# Patient Record
Sex: Female | Born: 1990 | Race: White | Hispanic: No | Marital: Single | Smoking: Never smoker
Health system: Southern US, Community
[De-identification: ages and names within clinical notes are randomized; demographics above are authoritative.]

## PROBLEM LIST (undated history)

## (undated) DIAGNOSIS — F988 Other specified behavioral and emotional disorders with onset usually occurring in childhood and adolescence: Secondary | ICD-10-CM

## (undated) HISTORY — PX: TONSILECTOMY, ADENOIDECTOMY, BILATERAL MYRINGOTOMY AND TUBES: SHX2538

## (undated) HISTORY — DX: Other specified behavioral and emotional disorders with onset usually occurring in childhood and adolescence: F98.8

---

## 2011-12-07 ENCOUNTER — Ambulatory Visit: Payer: Self-pay

## 2013-11-08 ENCOUNTER — Emergency Department: Payer: Self-pay | Admitting: Emergency Medicine

## 2013-11-16 ENCOUNTER — Ambulatory Visit: Payer: Self-pay | Admitting: Internal Medicine

## 2013-12-05 ENCOUNTER — Ambulatory Visit: Payer: Self-pay | Admitting: Internal Medicine

## 2013-12-05 LAB — HCG, QUANTITATIVE, PREGNANCY: Beta Hcg, Quant.: <1 m[IU]/mL — ABNORMAL LOW

## 2014-06-05 ENCOUNTER — Ambulatory Visit: Payer: Self-pay | Admitting: Unknown Physician Specialty

## 2014-06-07 LAB — PATHOLOGY REPORT

## 2014-06-23 IMAGING — US ABDOMEN ULTRASOUND
1 series · 13 of 25 positions shown · non-contrast
Comparison: None.

CLINICAL DATA: Generalized abdominal pain with frequent bowel
movements

EXAM:
ULTRASOUND ABDOMEN COMPLETE

[Series 1: abdomen ultrasound · 0.33mm/px · 13 of 74 slices shown]
[im 1/74]
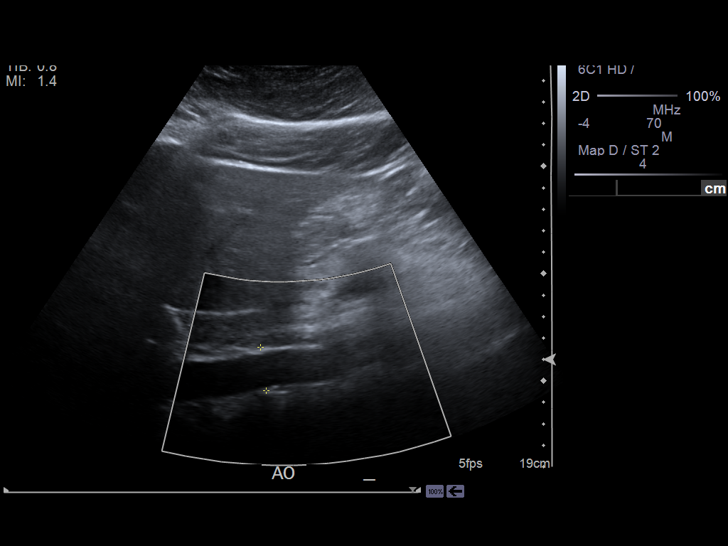
[im 7/74]
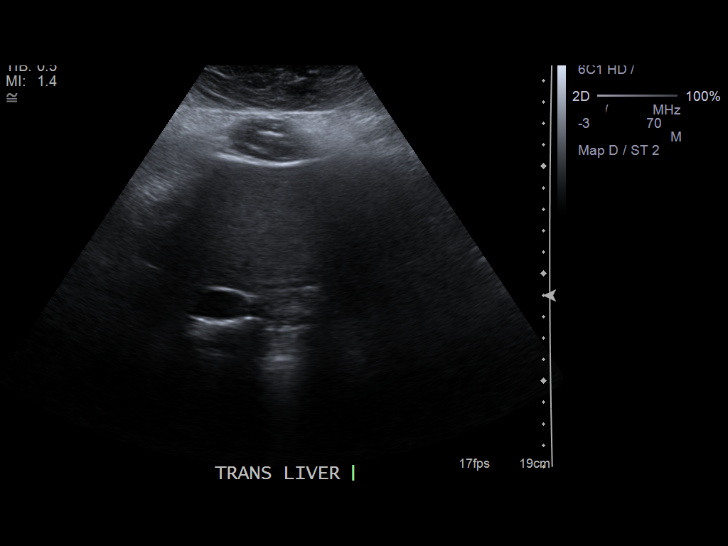
[im 13/74]
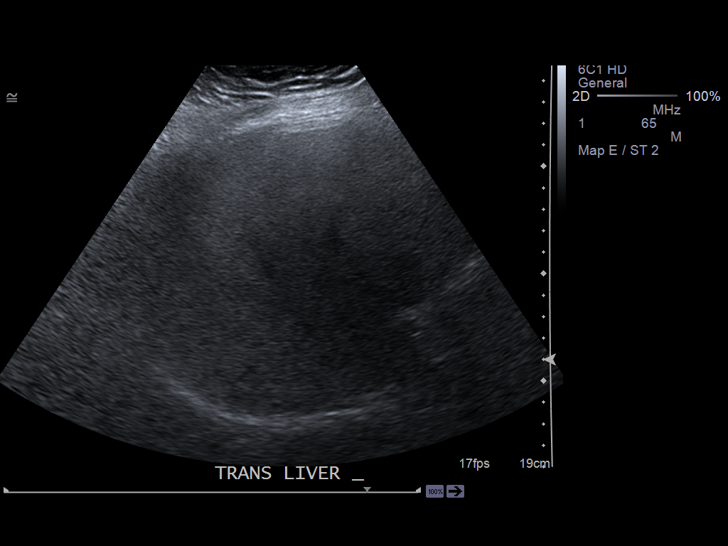
[im 19/74]
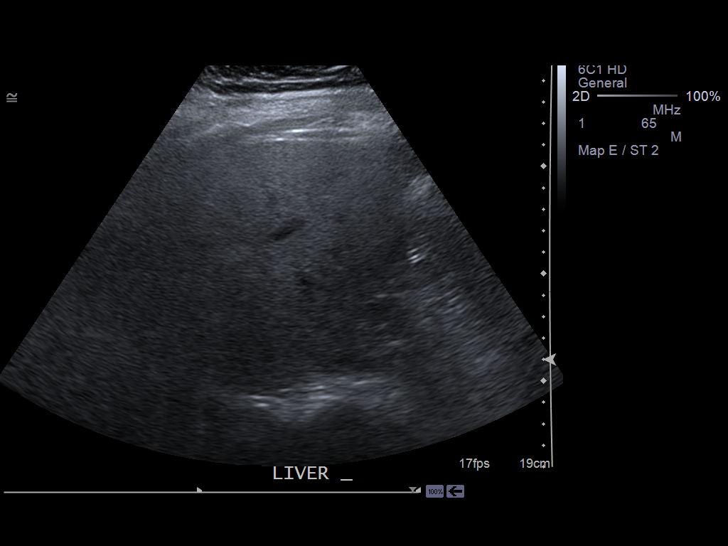
[im 25/74]
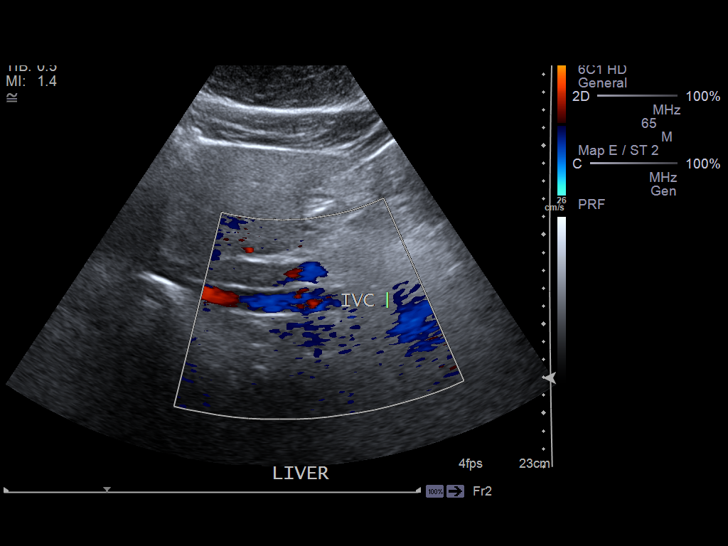
[im 31/74]
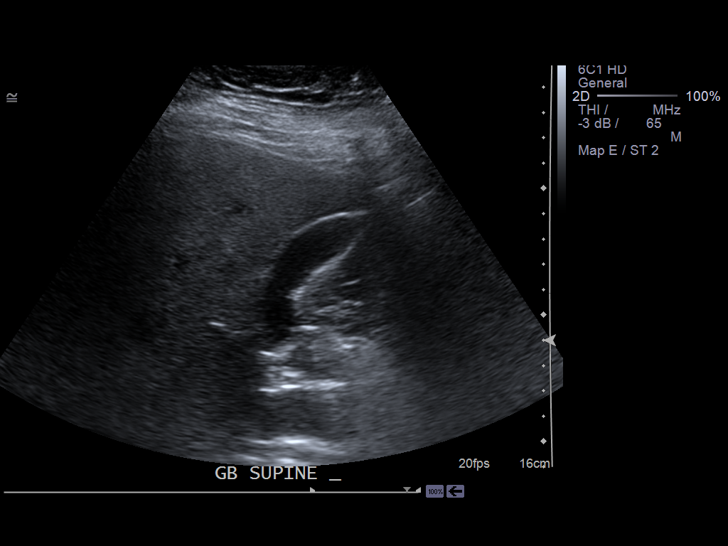
[im 37/74]
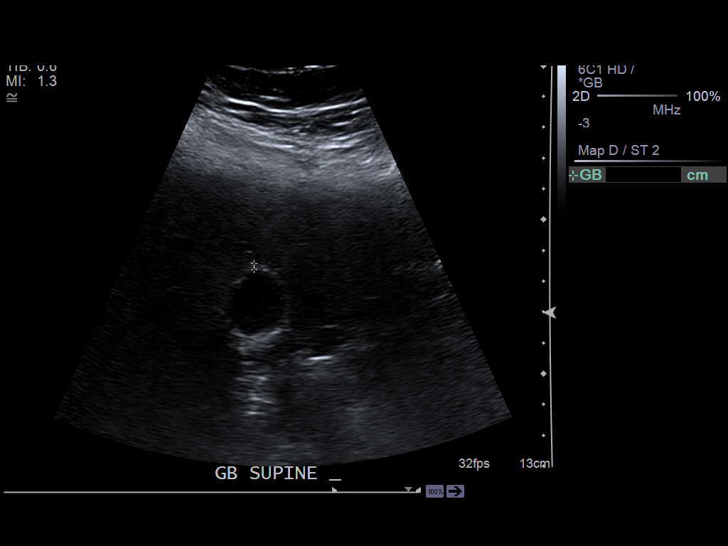
[im 43/74]
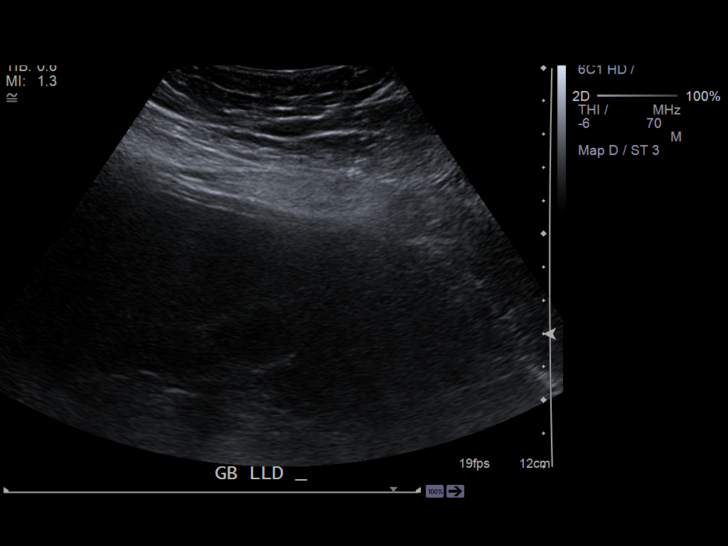
[im 49/74]
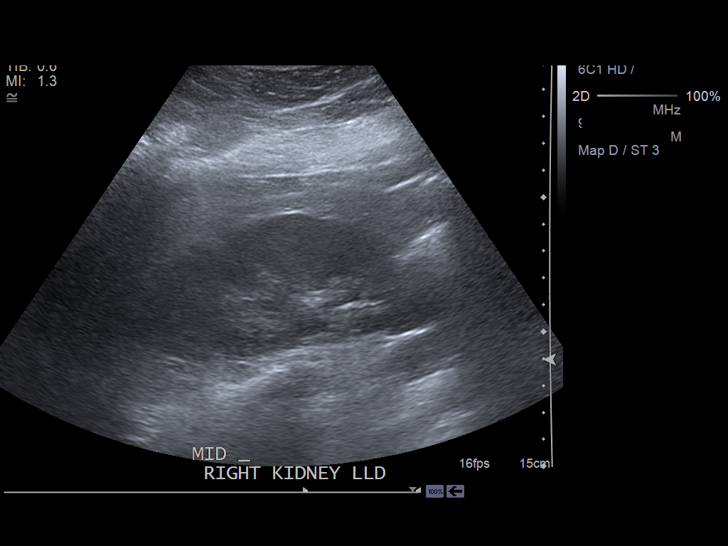
[im 55/74]
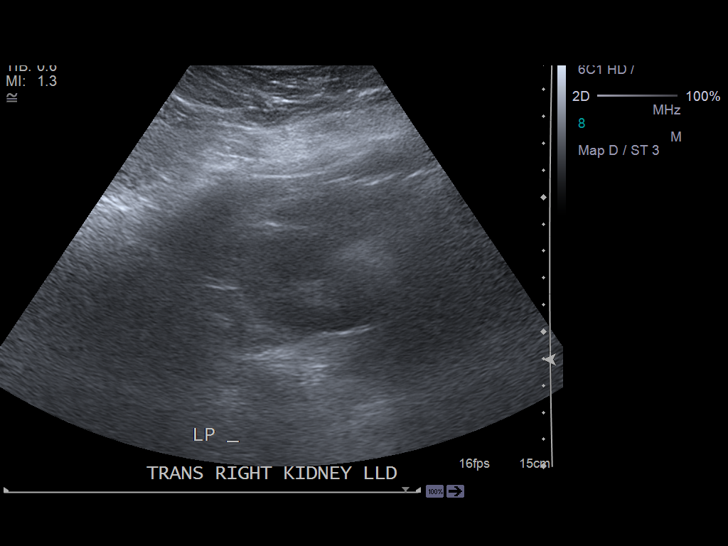
[im 61/74]
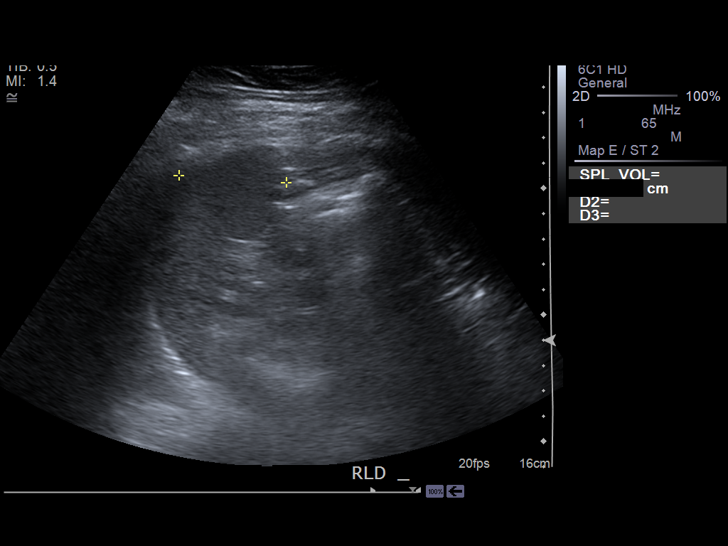
[im 67/74]
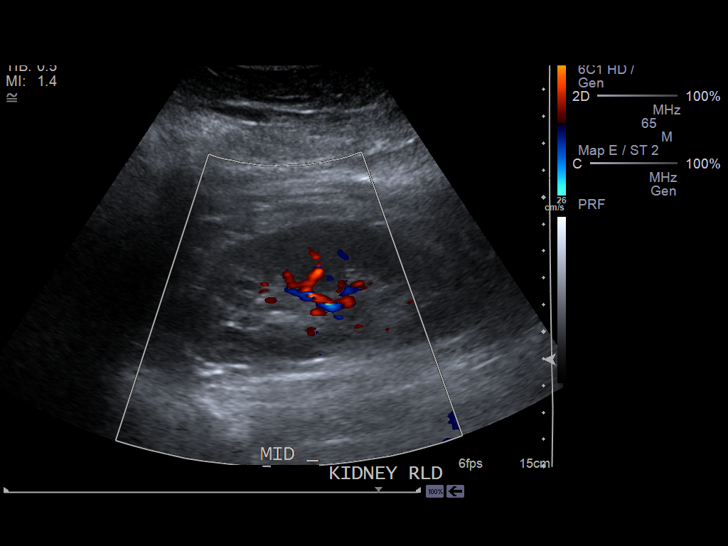
[im 74/74]
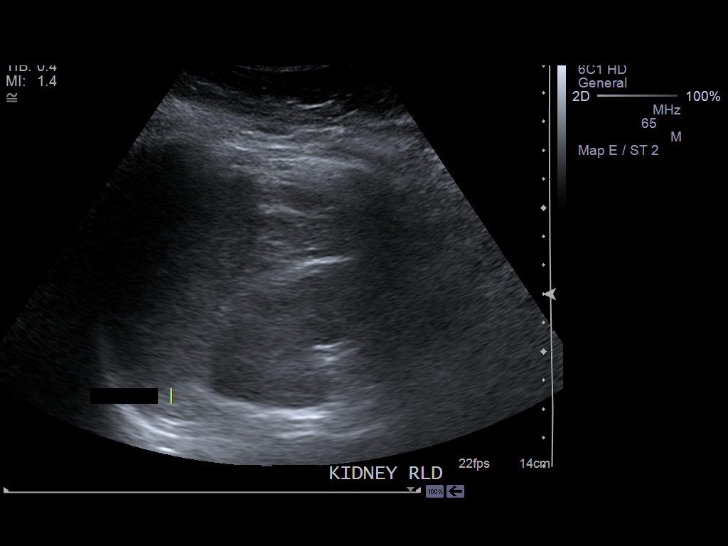

[13 of 25 positions shown; findings below may reference images not displayed]

FINDINGS: Gallbladder:

No gallstones or wall thickening visualized. No sonographic Murphy
sign noted.

Common bile duct:

Diameter: 2.5 mm

Liver:

An increase in echotexture is identified diffusely suggesting
underlying hepatic steatosis or hepatocellular dysfunction. No focal
parenchymal abnormality is seen with some limitations due
visualization of the right lobe of the liver and subdiaphragmatic
portions of the liver. No signs of intrahepatic ductal dilatation
are identified. The hepatic capsule appears smooth

IVC:

No abnormality visualized.

Pancreas:

Is poorly visualized due to shadowing by overlying gas. The limited
portion of the body which is seen is normal in size and echotexture

Spleen:

Has a sagittal length of 4.3 cm. No focal parenchymal abnormality is
noted

Right Kidney:

Length: 10.8 cm.. Overall visibility is somewhat limited due to poor
resolution due to shadowing from overlying bowel gas. Echogenicity
within normal limits. No mass or hydronephrosis visualized.

Left Kidney:

Length: 11.5 cm. Echogenicity within normal limits. No mass or
hydronephrosis visualized.

Abdominal aorta:

Is poorly visualized due to shadowing from overlying gas. Visualized
portions have a normal caliber with a maximal width of 2.0 cm.

Other findings:

None.
IMPRESSION: Findings suspicious for hepatic steatosis or underlying
hepatocellular dysfunction.

Limited evaluation of the pancreas, right kidney, portions of the
liver and aorta were possible today.

Otherwise negative.

## 2015-03-23 NOTE — Op Note (Signed)
PATIENT NAME:  Stacy Roth, Stacy Roth MR#:  [id number] DATE OF BIRTH:  03/09/1991  DATE OF PROCEDURE:  06/05/2014  PREOPERATIVE DIAGNOSIS:  Chronic adenotonsillitis.  POSTOPERATIVE DIAGNOSIS:  Chronic adenotonsillitis.  OPERATION:  Tonsillectomy and adenoidectomy.  SURGEON:  Davina Pokehapman T. Darcell Sabino, MD  ANESTHESIA:  General endotracheal.  OPERATIVE FINDINGS:  Large tonsils and adenoids.  DESCRIPTION OF THE PROCEDURE: [name] was identified in the holding area and taken to the operating room and placed in the supine position.  After general endotracheal anesthesia, the table was turned 45 degrees and the patient was draped in the usual fashion for a tonsillectomy.  A mouth gag was inserted into the oral cavity and examination of the oropharynx showed the uvula was non-bifid.  There was no evidence of submucous cleft to the palate.  There were large tonsils.  A red rubber catheter was placed through the nostril.  Examination of the nasopharynx showed large obstructing adenoids.  Under indirect vision with the mirror, an adenotome was placed in the nasopharynx.  The adenoids were curetted free.  Reinspection with a mirror showed excellent removal of the adenoid.  Nasopharyngeal packs were then placed.  The operation then turned to the tonsillectomy.  Beginning on the left-hand side a tenaculum was used to grasp the tonsil and the Bovie cautery was used to dissect it free from the fossa.  In a similar fashion, the right tonsil was removed.  Meticulous hemostasis was achieved using the Bovie cautery.  With both tonsils removed and no active bleeding, the nasopharyngeal packs were removed.  Suction cautery was then used to cauterize the nasopharyngeal bed to prevent bleeding.  The red rubber catheter was removed with no active bleeding. Then 0.5% plain Marcaine was used to inject the anterior and posterior tonsillar pillars bilaterally. A total of 8 mL was used.  The patient tolerated the procedure well and was awakened  in the operating room and taken to the recovery room in stable condition.   CULTURES:  None.  SPECIMENS:  Tonsils and adenoids.  ESTIMATED BLOOD LOSS:  Less than 20 mL.  ____________________________ Davina Pokehapman T. Kaily Wragg, MD ctm:sb D: 06/05/2014 10:01:33 ET T: 06/05/2014 10:27:59 ET JOB#: [id number]  cc: Davina Pokehapman T. Edith Groleau, MD, <Dictator> Davina PokeHAPMAN T Lillymae Duet MD ELECTRONICALLY SIGNED 06/26/2014 18:18

## 2018-06-06 ENCOUNTER — Other Ambulatory Visit: Payer: Self-pay

## 2018-06-06 MED ORDER — NORGESTIMATE-ETH ESTRADIOL 0.25-35 MG-MCG PO TABS: 1.0000 | ORAL_TABLET | Freq: Every day | ORAL | 1 refills | Status: DC

## 2018-06-10 ENCOUNTER — Other Ambulatory Visit: Payer: Self-pay | Admitting: Nurse Practitioner

## 2018-06-10 MED ORDER — NORGESTIMATE-ETH ESTRADIOL 0.25-35 MG-MCG PO TABS: 1.0000 | ORAL_TABLET | Freq: Every day | ORAL | 1 refills | Status: DC

## 2019-02-22 ENCOUNTER — Other Ambulatory Visit: Payer: Self-pay | Admitting: Internal Medicine

## 2019-07-13 ENCOUNTER — Ambulatory Visit: Payer: Self-pay | Admitting: Adult Health

## 2019-07-13 ENCOUNTER — Other Ambulatory Visit: Payer: Self-pay

## 2019-07-13 ENCOUNTER — Encounter: Payer: Self-pay | Admitting: Adult Health

## 2019-07-13 VITALS — BP 110/76 | HR 83 | Resp 16 | Ht 65.0 in | Wt 251.0 lb

## 2019-07-13 DIAGNOSIS — Z30011 Encounter for initial prescription of contraceptive pills: Secondary | ICD-10-CM

## 2019-07-13 DIAGNOSIS — F988 Other specified behavioral and emotional disorders with onset usually occurring in childhood and adolescence: Secondary | ICD-10-CM

## 2019-07-13 MED ORDER — NORGESTIM-ETH ESTRAD TRIPHASIC 0.18/0.215/0.25 MG-35 MCG PO TABS: 1.0000 | ORAL_TABLET | Freq: Every day | ORAL | 3 refills | Status: AC

## 2019-07-13 MED ORDER — AMPHETAMINE-DEXTROAMPHETAMINE 20 MG PO TABS: 20.0000 mg | ORAL_TABLET | Freq: Every day | ORAL | 0 refills | Status: AC

## 2019-07-13 MED ORDER — AMPHETAMINE-DEXTROAMPHETAMINE 20 MG PO TABS: 20.0000 mg | ORAL_TABLET | Freq: Two times a day (BID) | ORAL | 0 refills | Status: AC

## 2019-07-13 NOTE — Progress Notes (Signed)
[facility] [address] [place], [place] [address]  Internal MEDICINE  Office Visit Note  Patient Name: Stacy Roth  [id number]  [id number]  Date of Service: 07/13/2019  Chief Complaint  Patient presents with  . Medical Management of Chronic Issues    new patient reestablish care, bc pills refill and talk about adderall meds   . ADD    HPI  Pt is here for refills on meds.  She is currently living in [place] and working as a Journalist, newspaperorthopedic rep.  She has not changed her PCP to [place] because this move is not permanent.  She is in need of birth control refills, and would like to refill her adderall.  She has not had an adderall refill in quite some time.      Current Medication: Outpatient Encounter Medications as of 07/13/2019  Medication Sig  . amphetamine-dextroamphetamine (ADDERALL XR) 20 MG 24 hr capsule   . amphetamine-dextroamphetamine (ADDERALL) 20 MG tablet Take 1 tablet (20 mg total) by mouth 2 (two) times daily.  . Norgestimate-Ethinyl Estradiol Triphasic (TRI-PREVIFEM) 0.18/0.215/0.25 MG-35 MCG tablet Take 1 tablet by mouth daily.  . [DISCONTINUED] amphetamine-dextroamphetamine (ADDERALL) 20 MG tablet Take 20 mg by mouth daily.  . [DISCONTINUED] TRI-PREVIFEM 0.18/0.215/0.25 MG-35 MCG tablet TAKE BY MOUTH AS DIRECTED  . [DISCONTINUED] norgestimate-ethinyl estradiol (ORTHO-CYCLEN,SPRINTEC,PREVIFEM) 0.25-35 MG-MCG tablet Take 1 tablet by mouth daily. (Patient not taking: Reported on 07/13/2019)   No facility-administered encounter medications on file as of 07/13/2019.     Surgical History: Past Surgical History:  Procedure Laterality Date  . TONSILECTOMY, ADENOIDECTOMY, BILATERAL MYRINGOTOMY AND TUBES      Medical History: Past Medical History:  Diagnosis Date  . ADD (attention deficit disorder)     Family History: Family History  Problem Relation Age of Onset  . Hypertension Father     Social History   Socioeconomic History  . Marital status:  Single    Spouse name: Not on file  . Number of children: Not on file  . Years of education: Not on file  . Highest education level: Not on file  Occupational History  . Not on file  Social Needs  . Financial resource strain: Not on file  . Food insecurity    Worry: Not on file    Inability: Not on file  . Transportation needs    Medical: Not on file    Non-medical: Not on file  Tobacco Use  . Smoking status: Never Smoker  . Smokeless tobacco: Never Used  Substance and Sexual Activity  . Alcohol use: Yes  . Drug use: Never  . Sexual activity: Not on file  Lifestyle  . Physical activity    Days per week: Not on file    Minutes per session: Not on file  . Stress: Not on file  Relationships  . Social Musicianconnections    Talks on phone: Not on file    Gets together: Not on file    Attends religious service: Not on file    Active member of club or organization: Not on file    Attends meetings of clubs or organizations: Not on file    Relationship status: Not on file  . Intimate partner violence    Fear of current or ex partner: Not on file    Emotionally abused: Not on file    Physically abused: Not on file    Forced sexual activity: Not on file  Other Topics Concern  . Not on file  Social History  Narrative  . Not on file      Review of Systems  Constitutional: Negative for chills, fatigue and unexpected weight change.  HENT: Negative for congestion, rhinorrhea, sneezing and sore throat.   Eyes: Negative for photophobia, pain and redness.  Respiratory: Negative for cough, chest tightness and shortness of breath.   Cardiovascular: Negative for chest pain and palpitations.  Gastrointestinal: Negative for abdominal pain, constipation, diarrhea, nausea and vomiting.  Endocrine: Negative.   Genitourinary: Negative for dysuria and frequency.  Musculoskeletal: Negative for arthralgias, back pain, joint swelling and neck pain.  Skin: Negative for rash.  Allergic/Immunologic:  Negative.   Neurological: Negative for tremors and numbness.  Hematological: Negative for adenopathy. Does not bruise/bleed easily.  Psychiatric/Behavioral: Negative for behavioral problems and sleep disturbance. The patient is not nervous/anxious.     Vital Signs: BP 110/76   Pulse 83   Resp 16   Ht 5\' 5"  (1.651 m)   Wt 251 lb (113.9 kg)   SpO2 98%   BMI 41.77 kg/m    Physical Exam Vitals signs and nursing note reviewed.  Constitutional:      General: She is not in acute distress.    Appearance: She is well-developed. She is not diaphoretic.  HENT:     Head: Normocephalic and atraumatic.     Mouth/Throat:     Mouth: Mucous membranes are dry.     Pharynx: No oropharyngeal exudate.  Eyes:     Pupils: Pupils are equal, round, and reactive to light.  Neck:     Musculoskeletal: Normal range of motion and neck supple.     Thyroid: No thyromegaly.     Vascular: No JVD.     Trachea: No tracheal deviation.  Cardiovascular:     Rate and Rhythm: Normal rate and regular rhythm.     Heart sounds: Normal heart sounds. No murmur. No friction rub. No gallop.   Pulmonary:     Effort: Pulmonary effort is normal. No respiratory distress.     Breath sounds: Normal breath sounds. No wheezing or rales.  Chest:     Chest wall: No tenderness.  Abdominal:     Palpations: Abdomen is soft.     Tenderness: There is no abdominal tenderness. There is no guarding.  Musculoskeletal: Normal range of motion.  Lymphadenopathy:     Cervical: No cervical adenopathy.  Skin:    General: Skin is warm and dry.  Neurological:     Mental Status: She is alert and oriented to person, place, and time.     Cranial Nerves: No cranial nerve deficit.  Psychiatric:        Behavior: Behavior normal.        Thought Content: Thought content normal.        Judgment: Judgment normal.    Assessment/Plan: 1. Attention deficit disorder (ADD) without hyperactivity Refilled Controlled medications today. Reviewed  risks and possible side effects associated with taking Stimulants. Combination of these drugs with other psychotropic medications could cause dizziness and drowsiness. Pt needs to Monitor symptoms and exercise caution in driving and operating heavy machinery to avoid damages to oneself, to others and to the surroundings. Patient verbalized understanding in this matter. Dependence and abuse for these drugs will be monitored closely. A Controlled substance policy and procedure is on file which allows [facility] to order a urine drug screen test at any visit. Patient understands and agrees with the plan.. - amphetamine-dextroamphetamine (ADDERALL) 20 MG tablet; Take 1 tablet (20 mg total) by  mouth 2 (two) times daily.  Dispense: 60 tablet; Refill: 0 - amphetamine-dextroamphetamine (ADDERALL) 20 MG tablet; Take 1 tablet (20 mg total) by mouth daily.  Dispense: 60 tablet; Refill: 0 - amphetamine-dextroamphetamine (ADDERALL) 20 MG tablet; Take 1 tablet (20 mg total) by mouth 2 (two) times daily.  Dispense: 60 tablet; Refill: 0  2. Encounter for initial prescription of contraceptive pills - Norgestimate-Ethinyl Estradiol Triphasic (TRI-PREVIFEM) 0.18/0.215/0.25 MG-35 MCG tablet; Take 1 tablet by mouth daily.  Dispense: 84 tablet; Refill: 3  General Counseling: Danaya verbalizes understanding of the findings of todays visit and agrees with plan of treatment. I have discussed any further diagnostic evaluation that may be needed or ordered today. We also reviewed her medications today. she has been encouraged to call the office with any questions or concerns that should arise related to todays visit.    No orders of the defined types were placed in this encounter.   Meds ordered this encounter  Medications  . Norgestimate-Ethinyl Estradiol Triphasic (TRI-PREVIFEM) 0.18/0.215/0.25 MG-35 MCG tablet    Sig: Take 1 tablet by mouth daily.    Dispense:  84 tablet    Refill:  3    Please fill 90 day  at a time.  Marland Kitchen amphetamine-dextroamphetamine (ADDERALL) 20 MG tablet    Sig: Take 1 tablet (20 mg total) by mouth 2 (two) times daily.    Dispense:  60 tablet    Refill:  0    Time spent: 15 Minutes   This patient was seen by Orson Gear AGNP-C in Collaboration with Dr Lavera Guise as a part of collaborative care agreement     Kendell Bane AGNP-C Internal medicine
